# Patient Record
Sex: Female | Born: 1995 | Race: White | Hispanic: No | Marital: Married | Smoking: Never smoker
Health system: Southern US, Community
[De-identification: ages and names within clinical notes are randomized; demographics above are authoritative.]

## PROBLEM LIST (undated history)

## (undated) DIAGNOSIS — F419 Anxiety disorder, unspecified: Secondary | ICD-10-CM

---

## 2014-11-14 ENCOUNTER — Ambulatory Visit
Admit: 2014-11-14 | Disposition: A | Discharge status: Home / Self Care | Payer: Self-pay | Attending: Family Medicine | Admitting: Family Medicine

## 2016-03-21 ENCOUNTER — Ambulatory Visit
Admission: EM | Admit: 2016-03-21 | Discharge: 2016-03-21 | Disposition: A | Discharge status: Home / Self Care | Payer: Self-pay | Attending: Family Medicine | Admitting: Family Medicine

## 2016-03-21 DIAGNOSIS — N760 Acute vaginitis: Secondary | ICD-10-CM

## 2016-03-21 LAB — URINALYSIS COMPLETE WITH MICROSCOPIC (ARMC ONLY)
BILIRUBIN URINE: NEGATIVE
Bacteria, UA: NONE SEEN
Glucose, UA: NEGATIVE mg/dL
HGB URINE DIPSTICK: NEGATIVE
KETONES UR: NEGATIVE mg/dL
LEUKOCYTES UA: NEGATIVE
NITRITE: NEGATIVE
PH: 5.5 (ref 5.0–8.0)
Protein, ur: NEGATIVE mg/dL
RBC / HPF: NONE SEEN RBC/hpf (ref 0–5)
SPECIFIC GRAVITY, URINE: 1.015 (ref 1.005–1.030)

## 2016-03-21 LAB — PREGNANCY, URINE: Preg Test, Ur: NEGATIVE

## 2016-03-21 LAB — CHLAMYDIA/NGC RT PCR (ARMC ONLY)
Chlamydia Tr: NOT DETECTED
N GONORRHOEAE: NOT DETECTED

## 2016-03-21 LAB — WET PREP, GENITAL
Clue Cells Wet Prep HPF POC: NONE SEEN
SPERM: NONE SEEN
Trich, Wet Prep: NONE SEEN
Yeast Wet Prep HPF POC: NONE SEEN

## 2016-03-21 MED ORDER — FLUCONAZOLE 150 MG PO TABS: 150.0000 mg | ORAL_TABLET | Freq: Every day | ORAL | 0 refills | Status: DC

## 2016-03-21 NOTE — Discharge Instructions (Signed)
Take medication as prescribed. Rest. Drink plenty of fluids. Avoid douching.   Follow up with your primary care physician this week as needed. Return to Urgent care for new or worsening concerns.

## 2016-03-21 NOTE — ED Provider Notes (Signed)
[facility] ____________________________________________  Time seen: Approximately 2:35 PM  I have reviewed the triage vital signs and the nursing notes.   HISTORY  Chief Complaint Vaginal Itching   HPI Belinda Taylor is a 20 y.o. female presents with complaints of 5 days of vaginal itching and occasional vaginal discharge. Patient reports vaginal discharge is a whitish color. Denies odorous discharge. Patient denies any urinary frequency, urinary urgency or pain with urination. Denies concerns of STDs. Patient reports she is sexually active with one partner, denies concerns of STDs. Patient reports she was feeling a slight vaginal pressure sensation, now resolved and denies pain.  Denies pain at this time. Denies fevers. Reports continues to eat and drink well. Reports last sexual encounter was approximate 4 days ago. Denies douching. Denies chest pain, shortness of breath, abdominal pain, dysuria, neck pain, back pain, rash, extremity pain, extremity swelling. Denies recent medication changes or recent antibiotic use. Denies vaginal pain.  Patient's last menstrual period was 03/17/2016 (exact date). Denies chance of pregnancy.   History reviewed. No pertinent past medical history.  There are no active problems to display for this patient.   History reviewed. No pertinent surgical history.    No current facility-administered medications for this encounter.   Current Outpatient Prescriptions:  .  fluconazole (DIFLUCAN) 150 MG tablet, Take 1 tablet (150 mg total) by mouth daily. Take one pill orally, then Repeat in one week as needed., Disp: 2 tablet, Rfl: 0  Allergies Amoxicillin and Penicillins  History reviewed. No pertinent family history.  Social History Social History  Substance Use Topics  . Smoking status: Never Smoker  . Smokeless tobacco: Never Used  . Alcohol use No    Review of Systems Constitutional: No fever/chills Eyes: No visual  changes. ENT: No sore throat. Cardiovascular: Denies chest pain. Respiratory: Denies shortness of breath. Gastrointestinal: No abdominal pain.  No nausea, no vomiting.  No diarrhea.  No constipation. Genitourinary: Negative for dysuria. Musculoskeletal: Negative for back pain. Skin: Negative for rash. Neurological: Negative for headaches, focal weakness or numbness.  10-point ROS otherwise negative.  ____________________________________________   PHYSICAL EXAM:  VITAL SIGNS: ED Triage Vitals  Enc Vitals Group     BP 03/21/16 1318 118/82     Pulse Rate 03/21/16 1318 67     Resp 03/21/16 1318 15     Temp 03/21/16 1318 98.7 F (37.1 C)     Temp Source 03/21/16 1318 Oral     SpO2 03/21/16 1318 100 %     Weight 03/21/16 1316 196 lb (88.9 kg)     Height 03/21/16 1316 5\' 6"  (1.676 m)     Head Circumference --      Peak Flow --      Pain Score 03/21/16 1318 6     Pain Loc --      Pain Edu? --      Excl. in [place]? --     Constitutional: Alert and oriented. Well appearing and in no acute distress. Eyes: Conjunctivae are normal. PERRL. EOMI. ENT      Head: Normocephalic and atraumatic.      Nose: No congestion/rhinnorhea.      Mouth/Throat: Mucous membranes are moist.Oropharynx non-erythematous. Neck: No stridor. Supple without meningismus.  Hematological/Lymphatic/Immunilogical: No cervical lymphadenopathy. Cardiovascular: Normal rate, regular rhythm. Grossly normal heart sounds.  Good peripheral circulation. Respiratory: Normal respiratory effort without tachypnea nor retractions. Breath sounds are clear and equal bilaterally. No wheezes/rales/rhonchi.. Gastrointestinal: Soft and nontender. No distention. Normal Bowel sounds. No  CVA tenderness.  Pelvic: Completed with Melissa CMA at bedside as chaperone. External: Normal appearance, no rash or lesions noted. Speculum: Mild amount of white vaginal discharge, no bleeding, no foreign bodies, cervical is closed. Bimanual: Nontender.  No cervical or adnexal tenderness. Musculoskeletal:  Nontender with normal range of motion in all extremities. No midline cervical, thoracic or lumbar tenderness to palpation.  Neurologic:  Normal speech and language. No gross focal neurologic deficits are appreciated. Speech is normal. No gait instability.  Skin:  Skin is warm, dry and intact. No rash noted. Psychiatric: Mood and affect are normal. Speech and behavior are normal. Patient exhibits appropriate insight and judgment   ___________________________________________   LABS (all labs ordered are listed, but only abnormal results are displayed)  Labs Reviewed  WET PREP, GENITAL - Abnormal; Notable for the following:       Result Value   WBC, Wet Prep HPF POC FEW (*)    All other components within normal limits  URINALYSIS COMPLETEWITH MICROSCOPIC ([facility] ONLY) - Abnormal; Notable for the following:    Squamous Epithelial / LPF 0-5 (*)    All other components within normal limits  CHLAMYDIA/NGC RT PCR ([facility] ONLY)  PREGNANCY, URINE   ____________________________________________  RADIOLOGY  No results found. ____________________________________________   PROCEDURES Procedures     INITIAL IMPRESSION / ASSESSMENT AND PLAN / ED COURSE  Pertinent labs & imaging results that were available during my care of the patient were reviewed by me and considered in my medical decision making (see chart for details).  Well-appearing patient. No acute distress. Presents for the complaints of 5 days of vaginal itching and vaginal discharge. Urinalysis unremarkable. Wet prep with whitish discharge, wet prep negative for clue cells, negative trichomonas, negative for yeast, with a few WBCs present. Discussed in detail with patient suspects yeast vaginitis and will treat patient with Diflucan. Counsel regarding fluids, abstain from sexual intercourse until resolved, no douche and in close monitoring. Discussed indication, risks and  benefits of medications with patient.  Discussed follow up with Primary care physician this week. Discussed follow up and return parameters including no resolution or any worsening concerns. Patient verbalized understanding and agreed to plan.   ____________________________________________   FINAL CLINICAL IMPRESSION(S) / ED DIAGNOSES  Final diagnoses:  Vaginitis     Discharge Medication List as of 03/21/2016  2:46 PM    START taking these medications   Details  fluconazole (DIFLUCAN) 150 MG tablet Take 1 tablet (150 mg total) by mouth daily. Take one pill orally, then Repeat in one week as needed., Starting Sat 03/21/2016, Normal        Note: This dictation was prepared with Dragon dictation along with smaller phrase technology. Any transcriptional errors that result from this process are unintentional.    Clinical Course      Renford Dills, NP 03/21/16 1637    Renford Dills, NP 03/21/16 [phone]

## 2016-03-21 NOTE — ED Triage Notes (Signed)
Patient states that 5 days ago she started having lower abdominal pressure, vaginal itching. Patient states that itching is worse at night. Patient states that she has not had any urinary pain or burning. Patient has no known exposure to STDs.

## 2016-03-30 ENCOUNTER — Telehealth: Payer: Self-pay | Admitting: *Deleted

## 2016-03-30 NOTE — Telephone Encounter (Signed)
Called patient and informed her that her gonorrhea and chlamydia results came back negative. Patient confirmed understanding of information. 

## 2019-01-10 ENCOUNTER — Other Ambulatory Visit: Payer: Self-pay

## 2019-01-10 ENCOUNTER — Encounter: Payer: Self-pay | Admitting: Emergency Medicine

## 2019-01-10 ENCOUNTER — Other Ambulatory Visit: Payer: Self-pay | Admitting: Family Medicine

## 2019-01-10 ENCOUNTER — Ambulatory Visit
Admission: EM | Admit: 2019-01-10 | Discharge: 2019-01-10 | Disposition: A | Discharge status: Home / Self Care | Payer: Self-pay | Attending: Family Medicine | Admitting: Family Medicine

## 2019-01-10 ENCOUNTER — Ambulatory Visit (INDEPENDENT_AMBULATORY_CARE_PROVIDER_SITE_OTHER): Payer: Self-pay

## 2019-01-10 DIAGNOSIS — M25571 Pain in right ankle and joints of right foot: Secondary | ICD-10-CM

## 2019-01-10 DIAGNOSIS — W108XXA Fall (on) (from) other stairs and steps, initial encounter: Secondary | ICD-10-CM

## 2019-01-10 DIAGNOSIS — S93401A Sprain of unspecified ligament of right ankle, initial encounter: Secondary | ICD-10-CM

## 2019-01-10 HISTORY — DX: Anxiety disorder, unspecified: F41.9

## 2019-01-10 MED ORDER — MELOXICAM 15 MG PO TABS: 15.0000 mg | ORAL_TABLET | Freq: Every day | ORAL | 0 refills | Status: AC | PRN

## 2019-01-10 NOTE — ED Provider Notes (Signed)
[facility]    CSN: [id number] Arrival date & time: 01/10/19  1309  History   Chief Complaint Chief Complaint  Patient presents with  . Ankle Injury   HPI  23 year old female presents with the above complaint.  Patient reports that she fell down 3 steps coming down from her porch on Saturday.  She states that she twisted her ankle.  She reports right ankle swelling and moderate pain.  She states that it has been slowly improving with Tylenol, ibuprofen, rest, and ice.  However, she is concerned given continued pain as well as significant swelling.  She has significant bruising as well.  Worse with activity.  Denies foot pain.  No other associated symptoms.  No other complaints.  History reviewed as below. Past Medical History:  Diagnosis Date  . Anxiety    No past surgeries.  OB History   No obstetric history on file.    Home Medications    Prior to Admission medications   Medication Sig Start Date End Date Taking? Authorizing Provider  norgestimate-ethinyl estradiol (ORTHO-CYCLEN) 0.25-35 MG-MCG tablet Take 1 tablet by mouth daily.   Yes [provider]  sertraline (ZOLOFT) 50 MG tablet Take 50 mg by mouth daily.   Yes [provider]  meloxicam (MOBIC) 15 MG tablet Take 1 tablet (15 mg total) by mouth daily as needed. 01/10/19   Coral Spikes, DO   Social History Social History   Tobacco Use  . Smoking status: Never Smoker  . Smokeless tobacco: Never Used  Substance Use Topics  . Alcohol use: No  . Drug use: No   Allergies   Amoxicillin and Penicillins   Review of Systems Review of Systems   Physical Exam Triage Vital Signs ED Triage Vitals  Enc Vitals Group     BP 01/10/19 1326 (!) 140/92     Pulse Rate 01/10/19 1326 82     Resp 01/10/19 1326 18     Temp 01/10/19 1326 98.9 F (37.2 C)     Temp Source 01/10/19 1326 Oral     SpO2 01/10/19 1326 100 %     Weight 01/10/19 1323 210 lb (95.3 kg)     Height 01/10/19 1323 5\' 6"   (1.676 m)     Head Circumference --      Peak Flow --      Pain Score 01/10/19 1322 5     Pain Loc --      Pain Edu? --      Excl. in [place]? --    No data found.  Updated Vital Signs BP (!) 140/92 (BP Location: Right Arm)   Pulse 82   Temp 98.9 F (37.2 C) (Oral)   Resp 18   Ht 5\' 6"  (1.676 m)   Wt 95.3 kg   LMP 12/27/2018   SpO2 100%   BMI 33.89 kg/m   Visual Acuity Right Eye Distance:   Left Eye Distance:   Bilateral Distance:    Right Eye Near:   Left Eye Near:    Bilateral Near:     Physical Exam Vitals signs and nursing note reviewed.  Constitutional:      General: She is not in acute distress.    Appearance: Normal appearance. She is obese.  HENT:     Head: Normocephalic and atraumatic.  Eyes:     General:        Right eye: No discharge.        Left eye: No discharge.  Conjunctiva/sclera: Conjunctivae normal.  Pulmonary:     Effort: Pulmonary effort is normal. No respiratory distress.  Musculoskeletal:     Comments: Right ankle -significant swelling noted on the lateral aspect of the ankle as well as the medial aspect.  Bruising noted.  Neurological:     Mental Status: She is alert.  Psychiatric:        Mood and Affect: Mood normal.        Behavior: Behavior normal.    UC Treatments / Results  Labs (all labs ordered are listed, but only abnormal results are displayed) Labs Reviewed - No data to display  EKG None  Radiology Dg Ankle Complete Right  Result Date: 01/10/2019 CLINICAL DATA:  Right ankle pain after fall down steps yesterday. EXAM: RIGHT ANKLE - COMPLETE 3+ VIEW COMPARISON:  None. FINDINGS: There is no evidence of fracture, dislocation, or joint effusion. There is no evidence of arthropathy or other focal bone abnormality. Soft tissues are unremarkable. IMPRESSION: Negative. Electronically Signed   By: Lupita RaiderJames  Green Jr M.D.   On: 01/10/2019 13:45    Procedures Procedures (including critical care time)  Medications Ordered in UC  Medications - No data to display  Initial Impression / Assessment and Plan / UC Course  I have reviewed the triage vital signs and the nursing notes.  Pertinent labs & imaging results that were available during my care of the patient were reviewed by me and considered in my medical decision making (see chart for details).    23 year old female presents with a right ankle sprain.  X-ray negative.  Advised rest, ice, elevation.  Meloxicam as directed.  Final Clinical Impressions(s) / UC Diagnoses   Final diagnoses:  Sprain of right ankle, unspecified ligament, initial encounter   Discharge Instructions   None    ED Prescriptions    Medication Sig Dispense Auth. Provider   meloxicam (MOBIC) 15 MG tablet Take 1 tablet (15 mg total) by mouth daily as needed. 30 tablet Tommie Samsook, Cylah Fannin G, DO     Controlled Substance Prescriptions [place] Controlled Substance Registry consulted? Not Applicable   Tommie SamsCook, Terree Gaultney G, DO 01/10/19 1357

## 2019-01-10 NOTE — ED Triage Notes (Signed)
Patient states she fell on Saturday injuring her right ankle. She is complaining of pain and swelling to the right ankle area.

## 2019-06-30 ENCOUNTER — Ambulatory Visit (LOCAL_COMMUNITY_HEALTH_CENTER): Payer: Medicaid Other | Admitting: Advanced Practice Midwife

## 2019-06-30 ENCOUNTER — Other Ambulatory Visit: Payer: Self-pay

## 2019-06-30 VITALS — BP 105/73 | Ht 66.0 in | Wt 231.0 lb

## 2019-06-30 DIAGNOSIS — F419 Anxiety disorder, unspecified: Secondary | ICD-10-CM | POA: Insufficient documentation

## 2019-06-30 DIAGNOSIS — E669 Obesity, unspecified: Secondary | ICD-10-CM

## 2019-06-30 DIAGNOSIS — Z3009 Encounter for other general counseling and advice on contraception: Secondary | ICD-10-CM | POA: Diagnosis not present

## 2019-06-30 DIAGNOSIS — Z30011 Encounter for initial prescription of contraceptive pills: Secondary | ICD-10-CM

## 2019-06-30 MED ORDER — NORGESTIM-ETH ESTRAD TRIPHASIC 0.18/0.215/0.25 MG-25 MCG PO TABS
1.0000 | ORAL_TABLET | Freq: Every day | ORAL | 0 refills | Status: DC
Start: 2019-06-30 — End: 2019-07-27

## 2019-06-30 NOTE — Progress Notes (Addendum)
Here today for Contraception. Prefers Ocp's. Last PE was early 2019 with second pregnancy. Unsure of last Pap Smear. Requests evaluation for renewal/continuation of Zoloft. Declines STD testing as well as bloodwork. Hal Morales, RN

## 2019-06-30 NOTE — Progress Notes (Signed)
   [facility] problem visit  [place] Department  Subjective:  Kennice Finnie is a 23 y.o.G2P2 being seen today for needs more ocp refill and Zoloft refill given by Ob/Gyn in [place] during last pregnancy 03/2018  Chief Complaint  Patient presents with  . Contraception    HPI  Nonsmoker given ocp's after SVD at 56 3/7 on 04/13/18 by Ob/Gyn in [place] who also dx'd her during pregnancy with depression/anxiety and gave her Zoloft.  She thinks she ran out of Zoloft 01/2019 and ran out of ocp's maybe 03/2019.  Last coitus last night without condom.  Time before was 06/23/19 without condom.  Does not want pregnancy.  LMP 06/26/19.  Wants ocp's and Zoloft today. Does the patient have a current or past history of drug use? No   No components found for: HCV]   Health Maintenance Due  Topic Date Due  . HIV Screening  09/02/2008  . PAP-Cervical Cytology Screening  09/02/2014  . PAP SMEAR-Modifier  09/02/2014  . INFLUENZA VACCINE  02/18/2019    ROS  The following portions of the patient's history were reviewed and updated as appropriate: allergies, current medications, past family history, past medical history, past social history, past surgical history and problem list. Problem list updated.  Pt denies any hx HTN, stroke, migraines, blood clots, cancer   See flowsheet for other program required questions.  Objective:   Vitals:   06/30/19 0904  BP: 105/73  Weight: 231 lb (104.8 kg)  Height: 5\' 6"  (1.676 m)    Physical Exam  n/a  Assessment and Plan:  Phylliss Strege is a 23 y.o. female presenting to the [facility] Department for a Women's Health problem visit  1. Obesity, unspecified classification, unspecified obesity type, unspecified whether serious comorbidity present   2. Anxiety Referred to [facility] per pt request - Ambulatory referral to [place]  3. Family planning See note below Needs to RTC before  completion of ocp's for physical, pap and more ocp's - Norgestimate-Ethinyl Estradiol Triphasic 0.18/0.215/0.25 MG-25 MCG tab; Take 1 tablet by mouth daily.  Dispense: 1 Package; Refill: 0  4. Encounter for initial prescription of contraceptive pills E-rx OTC #1 I po daily to begin today Counseled on abstinance next 7 days and if no menses after completion of first pack of ocp's then do PT and call if + Pt declines ECP Please give primary care MD list to pt     No follow-ups on file.  No future appointments.  Herbie Saxon, CNM

## 2019-07-27 ENCOUNTER — Ambulatory Visit (LOCAL_COMMUNITY_HEALTH_CENTER): Payer: Medicaid Other | Admitting: Physician Assistant

## 2019-07-27 ENCOUNTER — Encounter: Payer: Self-pay | Admitting: Physician Assistant

## 2019-07-27 ENCOUNTER — Other Ambulatory Visit: Payer: Self-pay

## 2019-07-27 VITALS — BP 133/82 | Ht 66.0 in | Wt 231.4 lb

## 2019-07-27 DIAGNOSIS — Z6837 Body mass index (BMI) 37.0-37.9, adult: Secondary | ICD-10-CM

## 2019-07-27 DIAGNOSIS — Z3009 Encounter for other general counseling and advice on contraception: Secondary | ICD-10-CM

## 2019-07-27 DIAGNOSIS — E669 Obesity, unspecified: Secondary | ICD-10-CM

## 2019-07-27 LAB — PREGNANCY, URINE: Preg Test, Ur: NEGATIVE

## 2019-07-27 MED ORDER — MULTIVITAMINS PO CAPS: 1.0000 | ORAL_CAPSULE | Freq: Every day | ORAL | 0 refills | Status: AC

## 2019-07-27 MED ORDER — NORGESTIM-ETH ESTRAD TRIPHASIC 0.18/0.215/0.25 MG-25 MCG PO TABS: 1.0000 | ORAL_TABLET | Freq: Every day | ORAL | 11 refills | Status: AC

## 2019-07-27 NOTE — Progress Notes (Signed)
Family Planning Visit- Initial Visit  Subjective:  Belinda Taylor is a 24 y.o. being seen today for an initial well woman visit and to discuss family planning options.  She is currently using OCP (estrogen/progesterone) for pregnancy prevention. Patient reports she does not want a pregnancy in the next year.  Patient has the following medical conditions has Obesity 231 lbs BMI=37.2 and Anxiety/depression on their problem list.  Chief Complaint  Patient presents with  . Contraception  . Gynecologic Exam    Patient reports she started OCPs per [facility] last month, likes them and wants to continue after that initial pack. LMP 06/26/19. Last Pap during last pregnancy in 2019 = NIL. States she forgot to take her OCP and took 2 the next day on about 3 occasions during the pack she is completing. Has h/o anxious depression, has taken Zoloft for this in past and finds it helpful. Plans to have ongoing management of her sx at [place].  Patient denies headaches, elevated blood pressure/HTN, bleeding/clotting disorder. Does not smoke.  Body mass index is 37.35 kg/m. - Patient is eligible for diabetes screening based on BMI and age >65?  not applicable KC1E ordered? not applicable  Patient reports 1 of partners in last year. Desires STI screening?  No - has one stable partner and is not symptomatic.  Does the patient have a current or past history of drug use? No   No components found for: HCV]   Health Maintenance Due  Topic Date Due  . HIV Screening  09/02/2008  . PAP-Cervical Cytology Screening  09/02/2014  . PAP SMEAR-Modifier  09/02/2014  . INFLUENZA VACCINE  02/18/2019    ROS  The following portions of the patient's history were reviewed and updated as appropriate: allergies, current medications, past family history, past medical history, past social history, past surgical history and problem list. Problem list updated.   See flowsheet for other program required  questions.  Objective:   Vitals:   07/27/19 1452  BP: 133/82  Weight: 231 lb 6.4 oz (105 kg)  Height: 5\' 6"  (1.676 m)    Physical Exam    Assessment and Plan:  Belinda Taylor is a 24 y.o. female presenting to the [facility] Department for an initial well woman exam/family planning visit  Contraception counseling: Reviewed all forms of birth control options in the tiered based approach. available including abstinence; over the counter/barrier methods; hormonal contraceptive medication including pill, patch, ring, injection,contraceptive implant; hormonal and nonhormonal IUDs; permanent sterilization options including vasectomy and the various tubal sterilization modalities. Risks, benefits, and typical effectiveness rates were reviewed.  Questions were answered.  Written information was also given to the patient to review.  Patient desires COCPs, this was prescribed for patient. She will follow up in  12 mo for surveillance.  She was told to call with any further questions, or with any concerns about this method of contraception.  Emphasized use of condoms 100% of the time for STI prevention.  Patient was offered ECP. ECP was not accepted by the patient. ECP counseling was not given.   1. Family planning services UPT is negative today. Extensive discussion of BCM by tiered approach, including LARCs. Pt desires OCPs. Careful counseling re: need to take 1 pill daily at same time, not skip pills, to optimize contraceptive efficacy. Start combined OCPs. Please give MVI with folic acid.  2. Class 2 obesity with body mass index (BMI) of 37.0 to 37.9 in adult, unspecified obesity type,  unspecified whether serious comorbidity present Enc referral to nutritionist, diet, exercise. F/u with PCP.     Return in about 1 year (around 07/26/2020) for annual well-woman visit.  No future appointments.  Landry Dyke, PA-C

## 2019-07-27 NOTE — Progress Notes (Signed)
Here today for Physical and birth control. States last physical was PP exam in 2019 at [facility] provider . Unsure if she had a Pap Smear. Wants more Ocp's for birth control. Tawny Hopping, RN

## 2019-08-19 ENCOUNTER — Encounter: Payer: Self-pay | Admitting: Physician Assistant

## 2019-08-19 LAB — HM PAP SMEAR

## 2021-01-20 IMAGING — CR RIGHT ANKLE - COMPLETE 3+ VIEW
3 series · 3 of 3 positions shown · non-contrast
Comparison: None.

CLINICAL DATA: Right ankle pain after fall down steps yesterday.

EXAM:
RIGHT ANKLE - COMPLETE 3+ VIEW

[ankle ap]
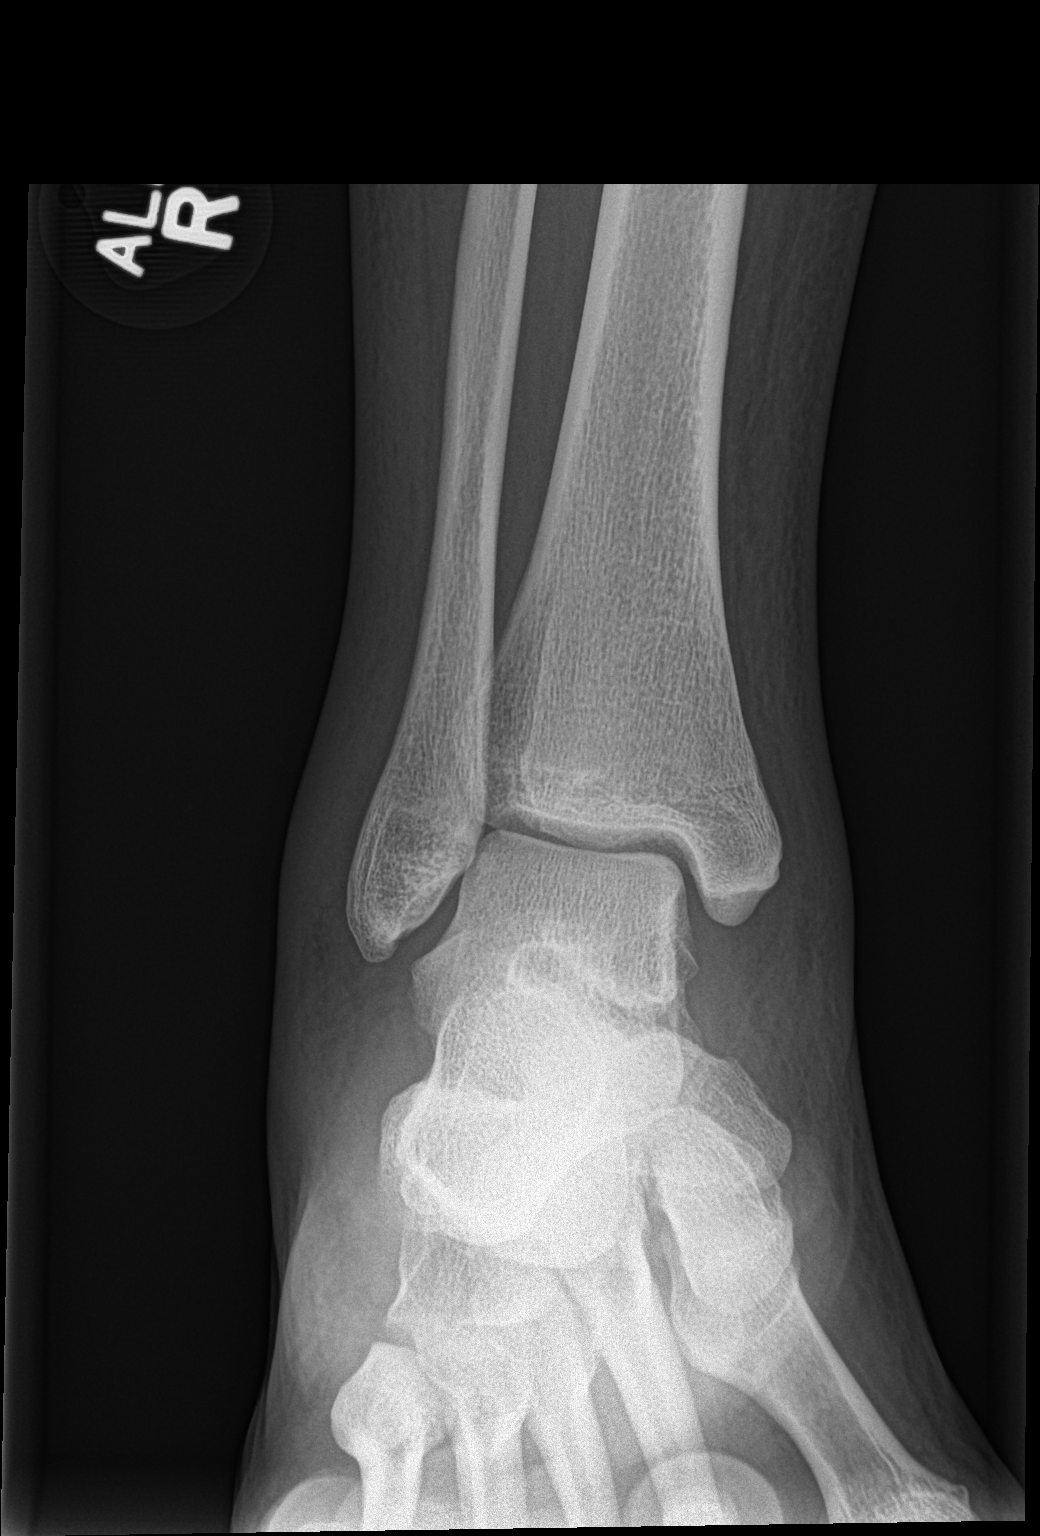

[ankle obl]
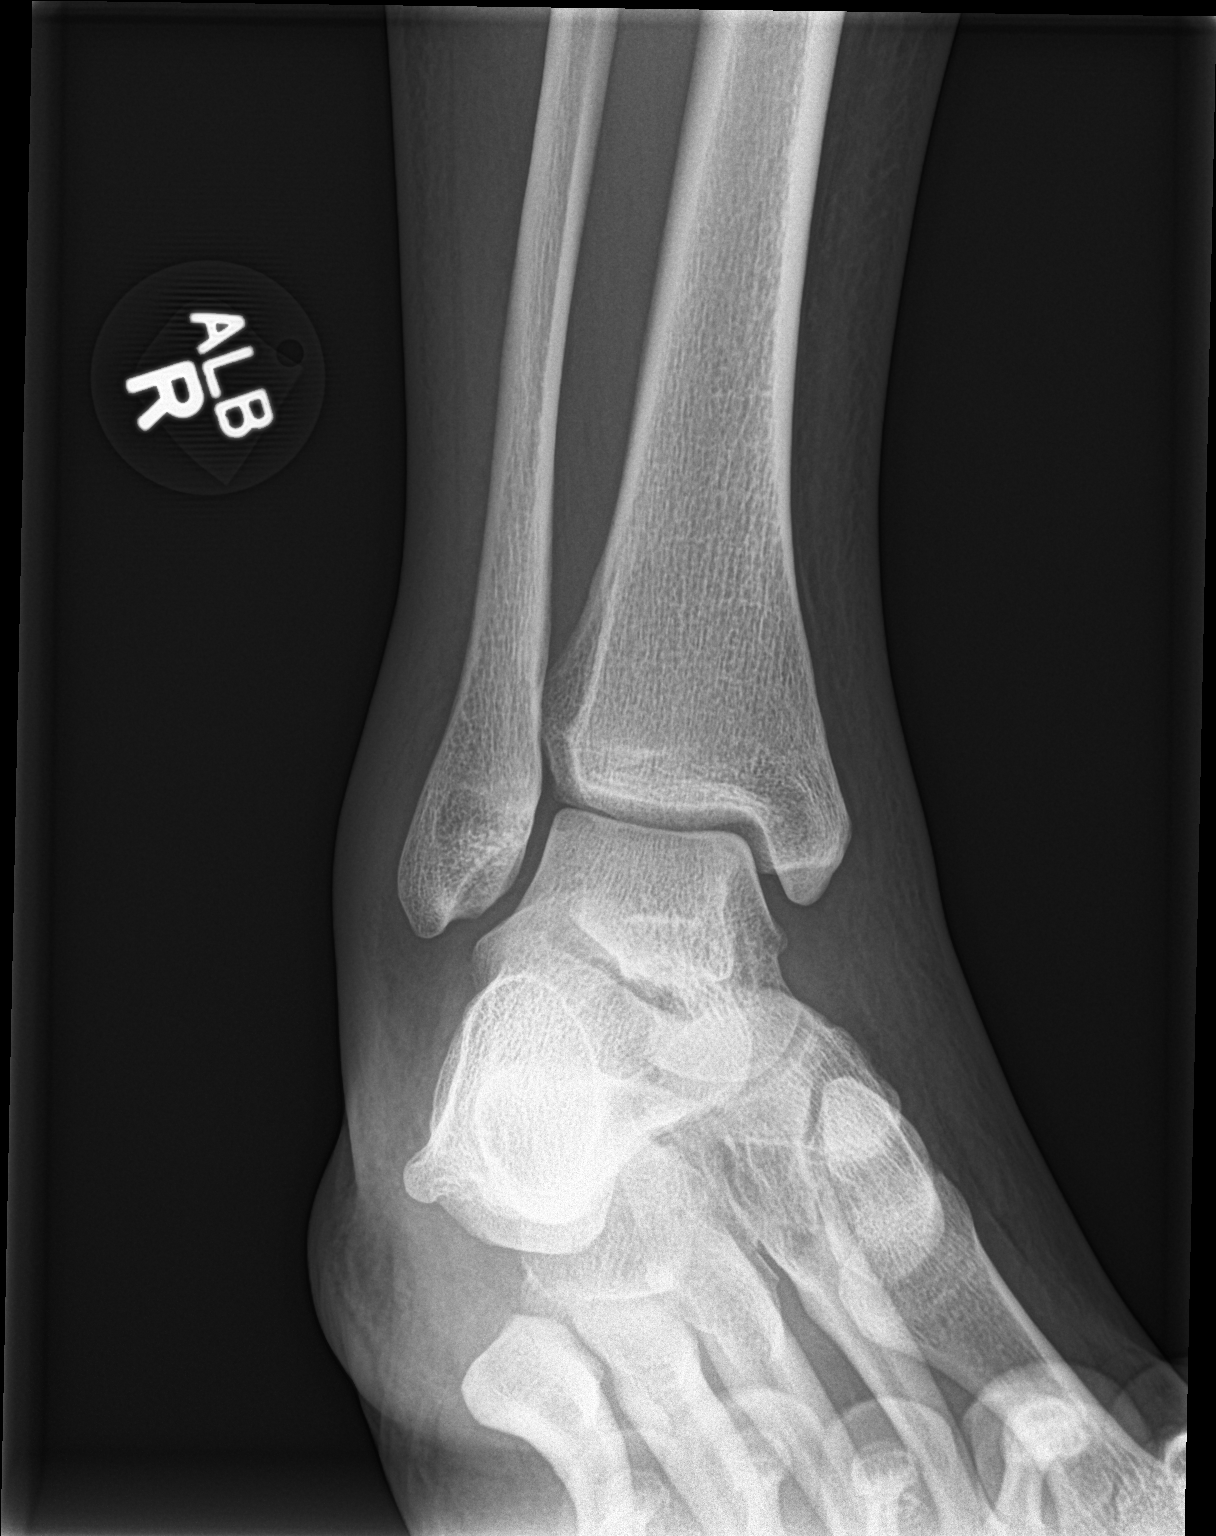

[ankle lat]
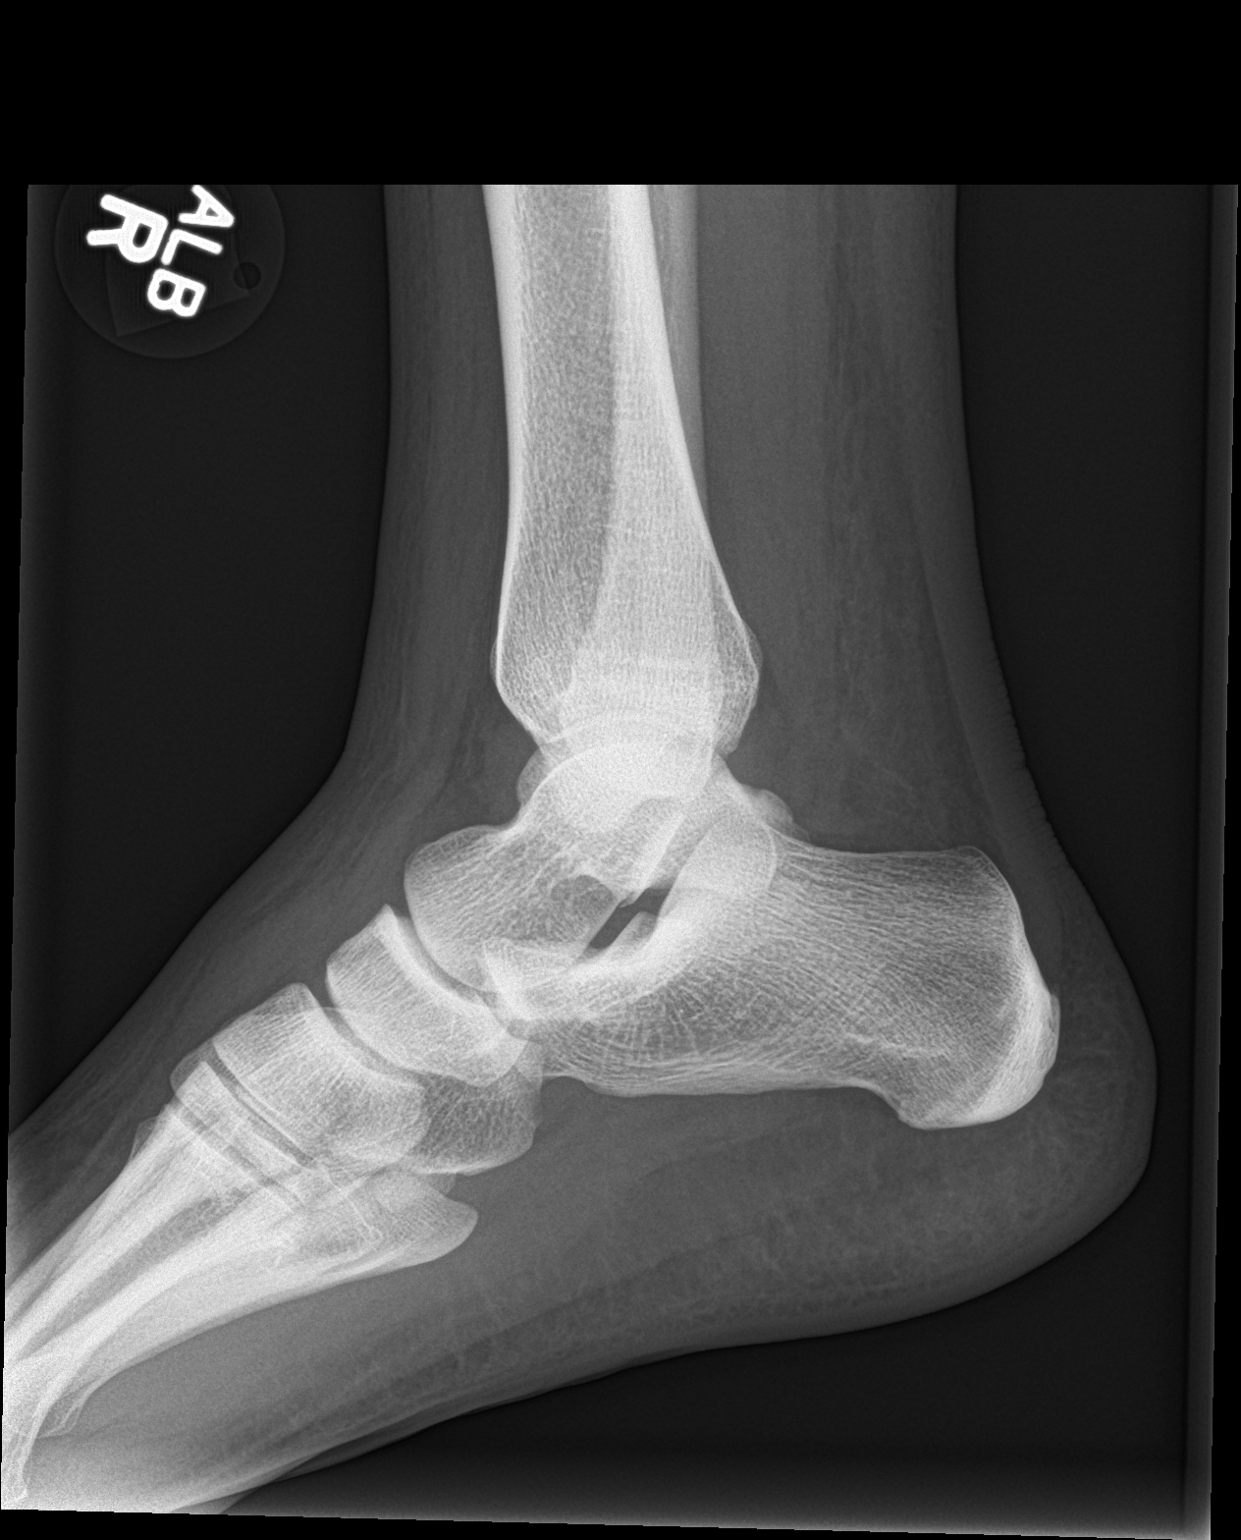

[3 of 3 positions shown; findings below may reference images not displayed]

FINDINGS: There is no evidence of fracture, dislocation, or joint effusion.
There is no evidence of arthropathy or other focal bone abnormality.
Soft tissues are unremarkable.
IMPRESSION: Negative.

## 2022-10-12 ENCOUNTER — Encounter: Payer: Self-pay | Admitting: Emergency Medicine

## 2022-10-12 ENCOUNTER — Ambulatory Visit
Admission: EM | Admit: 2022-10-12 | Discharge: 2022-10-12 | Disposition: A | Discharge status: Home / Self Care | Payer: Medicaid Other | Attending: Emergency Medicine | Admitting: Emergency Medicine

## 2022-10-12 DIAGNOSIS — N898 Other specified noninflammatory disorders of vagina: Secondary | ICD-10-CM | POA: Insufficient documentation

## 2022-10-12 DIAGNOSIS — N939 Abnormal uterine and vaginal bleeding, unspecified: Secondary | ICD-10-CM | POA: Insufficient documentation

## 2022-10-12 LAB — WET PREP, GENITAL
Clue Cells Wet Prep HPF POC: NONE SEEN
Sperm: NONE SEEN
Trich, Wet Prep: NONE SEEN
WBC, Wet Prep HPF POC: <10 — AB (ref <10)
Yeast Wet Prep HPF POC: NONE SEEN

## 2022-10-12 LAB — URINALYSIS, W/ REFLEX TO CULTURE (INFECTION SUSPECTED)
Bilirubin Urine: NEGATIVE
Glucose, UA: NEGATIVE mg/dL
Ketones, ur: NEGATIVE mg/dL
Leukocytes,Ua: NEGATIVE
Nitrite: NEGATIVE
Protein, ur: NEGATIVE mg/dL
Specific Gravity, Urine: 1.015 (ref 1.005–1.030)
WBC, UA: NONE SEEN WBC/hpf (ref 0–5)
pH: 5.5 (ref 5.0–8.0)

## 2022-10-12 MED ORDER — METRONIDAZOLE 500 MG PO TABS: 500.0000 mg | ORAL_TABLET | Freq: Two times a day (BID) | ORAL | 0 refills | Status: AC

## 2022-10-12 NOTE — ED Triage Notes (Signed)
Pt c/o vaginal itching, odor and lower back pain. Started about 4 days ago.

## 2022-10-12 NOTE — ED Provider Notes (Signed)
[facility]    CSN: [id number] Arrival date & time: 10/12/22  0907      History   Chief Complaint Chief Complaint  Patient presents with   Vaginitis    HPI Belinda Taylor is a 27 y.o. female.   Patient presents for evaluation of vaginal itching, vaginal odor, bilateral lower back pain, present for 4 days.  Has had intermittent vaginal spotting since symptoms began.  Has been experiencing mild urinary frequency but unsure if related to chronic GI issues.  Sexually active, no concern for STD.  Denies fevers, lower abdominal pain or pressure, vaginal discharge, dysuria.   Past Medical History:  Diagnosis Date   Anxiety     Patient Active Problem List   Diagnosis Date Noted   Obesity 231 lbs BMI=37.2 06/30/2019   Anxiety/depression 06/30/2019    History reviewed. No pertinent surgical history.  OB History   No obstetric history on file.      Home Medications    Prior to Admission medications   Medication Sig Start Date End Date Taking? Authorizing Provider  meloxicam (MOBIC) 15 MG tablet Take 1 tablet (15 mg total) by mouth daily as needed. Patient not taking: Reported on 06/30/2019 01/10/19   Coral Spikes, DO  Norgestimate-Ethinyl Estradiol Triphasic 0.18/0.215/0.25 MG-25 MCG tab Take 1 tablet by mouth daily. 07/27/19   Streilein, Annamarie, PA-C  sertraline (ZOLOFT) 50 MG tablet Take 50 mg by mouth daily.    [provider]    Family History Family History  Problem Relation Age of Onset   Hypertension Father    Diabetes Paternal Grandmother     Social History Social History   Tobacco Use   Smoking status: Never   Smokeless tobacco: Former  Scientific laboratory technician Use: Never used  Substance Use Topics   Alcohol use: No   Drug use: No     Allergies   Amoxicillin and Penicillins   Review of Systems Review of Systems   Physical Exam Triage Vital Signs ED Triage Vitals  Enc Vitals Group     BP 10/12/22 1040 136/80      Pulse Rate 10/12/22 1040 78     Resp 10/12/22 1040 16     Temp 10/12/22 1040 98.1 F (36.7 C)     Temp Source 10/12/22 1040 Oral     SpO2 10/12/22 1040 100 %     Weight 10/12/22 1039 231 lb 7.7 oz (105 kg)     Height 10/12/22 1039 5\' 6"  (1.676 m)     Head Circumference --      Peak Flow --      Pain Score 10/12/22 1038 0     Pain Loc --      Pain Edu? --      Excl. in [place]? --    No data found.  Updated Vital Signs BP 136/80 (BP Location: Right Arm)   Pulse 78   Temp 98.1 F (36.7 C) (Oral)   Resp 16   Ht 5\' 6"  (1.676 m)   Wt 231 lb 7.7 oz (105 kg)   LMP 09/28/2022 (Approximate)   SpO2 100%   BMI 37.36 kg/m   Visual Acuity Right Eye Distance:   Left Eye Distance:   Bilateral Distance:    Right Eye Near:   Left Eye Near:    Bilateral Near:     Physical Exam Constitutional:      Appearance: Normal appearance.  Eyes:     Extraocular Movements: Extraocular  movements intact.  Pulmonary:     Effort: Pulmonary effort is normal.  Abdominal:     General: Abdomen is flat. Bowel sounds are normal.     Palpations: Abdomen is soft.     Tenderness: There is abdominal tenderness in the suprapubic area.  Neurological:     Mental Status: She is alert and oriented to person, place, and time. Mental status is at baseline.      UC Treatments / Results  Labs (all labs ordered are listed, but only abnormal results are displayed) Labs Reviewed  WET PREP, GENITAL - Abnormal; Notable for the following components:      Result Value   WBC, Wet Prep HPF POC <10 (*)    All other components within normal limits  URINALYSIS, W/ REFLEX TO CULTURE (INFECTION SUSPECTED) - Abnormal; Notable for the following components:   Hgb urine dipstick SMALL (*)    Bacteria, UA RARE (*)    All other components within normal limits    EKG   Radiology No results found.  Procedures Procedures (including critical care time)  Medications Ordered in UC Medications - No data to  display  Initial Impression / Assessment and Plan / UC Course  I have reviewed the triage vital signs and the nursing notes.  Pertinent labs & imaging results that were available during my care of the patient were reviewed by me and considered in my medical decision making (see chart for details).  Spotting, vaginal itching and vaginal odor  Urinalysis and wet prep negative, treating prophylactically for BV despite testing as patient has had vaginal spotting in the past with positive BV testing, discussed, metronidazole prescribed, advised to keep alcohol use, discussed additional supportive measures, advised follow-up if symptoms continue to persist Final Clinical Impressions(s) / UC Diagnoses   Final diagnoses:  None   Discharge Instructions   None    ED Prescriptions   None    PDMP not reviewed this encounter.   Hans Eden, NP 10/12/22 1224

## 2022-10-12 NOTE — Discharge Instructions (Signed)
Today you are being treated prophylactically for  Bacterial vaginosis even though testing today was negative for bacterial vaginosis, yeast and trichomoniasis, you  had similar vaginal spotting in the past   Urinalysis negative for bladder infection  Take Metronidazole 500 mg twice a day for 7 days, do not drink alcohol while using medication, this will make you feel sick   Bacterial vaginosis which results from an overgrowth of one on several organisms that are normally present in your vagina. Vaginosis is an inflammation of the vagina that can result in discharge, itching and pain.  In addition: Avoid baths, hot tubs and whirlpool spas.  Don't use scented or harsh soaps Avoid irritants. These include scented tampons and pads. Wipe from front to back after using the toilet. Don't douche. Your vagina doesn't require cleansing other than normal bathing.  Use a condom.  Wear cotton underwear, this fabric absorbs some moisture.

## 2023-05-15 ENCOUNTER — Ambulatory Visit: Payer: Medicaid Other

## 2023-05-15 ENCOUNTER — Ambulatory Visit
Admission: EM | Admit: 2023-05-15 | Discharge: 2023-05-15 | Disposition: A | Discharge status: Home / Self Care | Payer: Medicaid Other | Attending: Physician Assistant | Admitting: Physician Assistant

## 2023-05-15 DIAGNOSIS — W19XXXA Unspecified fall, initial encounter: Secondary | ICD-10-CM | POA: Diagnosis not present

## 2023-05-15 DIAGNOSIS — B349 Viral infection, unspecified: Secondary | ICD-10-CM | POA: Insufficient documentation

## 2023-05-15 DIAGNOSIS — M546 Pain in thoracic spine: Secondary | ICD-10-CM | POA: Diagnosis not present

## 2023-05-15 DIAGNOSIS — J029 Acute pharyngitis, unspecified: Secondary | ICD-10-CM

## 2023-05-15 DIAGNOSIS — G8929 Other chronic pain: Secondary | ICD-10-CM | POA: Diagnosis not present

## 2023-05-15 DIAGNOSIS — Z87891 Personal history of nicotine dependence: Secondary | ICD-10-CM | POA: Insufficient documentation

## 2023-05-15 DIAGNOSIS — Z1152 Encounter for screening for COVID-19: Secondary | ICD-10-CM | POA: Diagnosis not present

## 2023-05-15 DIAGNOSIS — R509 Fever, unspecified: Secondary | ICD-10-CM | POA: Diagnosis not present

## 2023-05-15 DIAGNOSIS — R197 Diarrhea, unspecified: Secondary | ICD-10-CM | POA: Diagnosis not present

## 2023-05-15 DIAGNOSIS — R1032 Left lower quadrant pain: Secondary | ICD-10-CM | POA: Insufficient documentation

## 2023-05-15 DIAGNOSIS — R52 Pain, unspecified: Secondary | ICD-10-CM

## 2023-05-15 DIAGNOSIS — R0981 Nasal congestion: Secondary | ICD-10-CM

## 2023-05-15 LAB — SARS CORONAVIRUS 2 BY RT PCR: SARS Coronavirus 2 by RT PCR: NEGATIVE

## 2023-05-15 LAB — URINALYSIS, W/ REFLEX TO CULTURE (INFECTION SUSPECTED)
Bilirubin Urine: NEGATIVE
Glucose, UA: NEGATIVE mg/dL
Ketones, ur: 40 mg/dL — AB
Leukocytes,Ua: NEGATIVE
Nitrite: NEGATIVE
Protein, ur: NEGATIVE mg/dL
Specific Gravity, Urine: 1.015 (ref 1.005–1.030)
pH: 6 (ref 5.0–8.0)

## 2023-05-15 LAB — GROUP A STREP BY PCR: Group A Strep by PCR: NOT DETECTED

## 2023-05-15 MED ORDER — NAPROXEN 500 MG PO TABS: 500.0000 mg | ORAL_TABLET | Freq: Two times a day (BID) | ORAL | 0 refills | Status: AC

## 2023-05-15 NOTE — ED Triage Notes (Signed)
Pt c/o possible UTI x4days  Pt states that she fell on Tuesday and has had body chills since then.   Pt denies any vaginal odor or discharge.   Pt states that she has been having a dull ache in the lower left abdomen since Tuesday.

## 2023-05-15 NOTE — ED Provider Notes (Signed)
[facility]    CSN: [id number] Arrival date & time: 05/15/23  1027      History   Chief Complaint Chief Complaint  Patient presents with   Urinary Tract Infection    HPI Belinda Taylor is a 27 y.o. female presenting for 3 day history of low grade temps up to 100 degrees, chills, sweats, body aches, headaches, and mild fatigue. Reports sore throat and nasal congestion for the past couple of days. Denies cough, chest pain, or shortness of breath. Reports sharp intermittent left lower abdominal pain since yesterday. Has had upper back pain related to fall 4 days ago. History of chronic lower back pain. Denies nausea/vomiting. Mild diarrhea but has history of this.  Has Bm this morning and it was diarrhea. Denies urinary frequency/urgency or pain. Denies vaginal discharge or odor at this time. Daughter has a sore throat, but no other sick contacts. Has been taking Tylenol and Ibuprofen with last dose of Tylenol last night.   Patient was seen at emergency department yesterday for complaints of thoracic back pain after falling.  She had a CT of her thoracic spine which was negative.  She also complained of sore throat for the past couple days and had a negative rapid strep test.  Was advised to take ibuprofen and Tylenol.  HPI  Past Medical History:  Diagnosis Date   Anxiety     Patient Active Problem List   Diagnosis Date Noted   Obesity 231 lbs BMI=37.2 06/30/2019   Anxiety/depression 06/30/2019    History reviewed. No pertinent surgical history.  OB History   No obstetric history on file.      Home Medications    Prior to Admission medications   Medication Sig Start Date End Date Taking? Authorizing Provider  naproxen (NAPROSYN) 500 MG tablet Take 1 tablet (500 mg total) by mouth 2 (two) times daily. 05/15/23  Yes Shirlee Latch, PA-C  meloxicam (MOBIC) 15 MG tablet Take 1 tablet (15 mg total) by mouth daily as needed. Patient not taking: Reported on  06/30/2019 01/10/19   Tommie Sams, DO  metroNIDAZOLE (FLAGYL) 500 MG tablet Take 1 tablet (500 mg total) by mouth 2 (two) times daily. 10/12/22   Valinda Hoar, NP  Norgestimate-Ethinyl Estradiol Triphasic 0.18/0.215/0.25 MG-25 MCG tab Take 1 tablet by mouth daily. 07/27/19   Streilein, Annamarie, PA-C  sertraline (ZOLOFT) 50 MG tablet Take 50 mg by mouth daily.    [provider]    Family History Family History  Problem Relation Age of Onset   Hypertension Father    Diabetes Paternal Grandmother     Social History Social History   Tobacco Use   Smoking status: Never   Smokeless tobacco: Former  Building services engineer status: Never Used  Substance Use Topics   Alcohol use: No   Drug use: No     Allergies   Amoxicillin and Penicillins   Review of Systems Review of Systems  Constitutional:  Positive for chills, diaphoresis, fatigue and fever.  HENT:  Positive for congestion, rhinorrhea and sore throat.   Respiratory:  Negative for cough, shortness of breath and wheezing.   Cardiovascular:  Negative for chest pain.  Gastrointestinal:  Positive for diarrhea. Negative for abdominal pain, constipation, nausea and vomiting.  Genitourinary:  Negative for difficulty urinating, dysuria, frequency, hematuria and pelvic pain.  Musculoskeletal:  Positive for back pain. Negative for myalgias.  Neurological:  Positive for headaches. Negative for weakness.  Physical Exam Triage Vital Signs ED Triage Vitals  Encounter Vitals Group     BP      Systolic BP Percentile      Diastolic BP Percentile      Pulse      Resp      Temp      Temp src      SpO2      Weight      Height      Head Circumference      Peak Flow      Pain Score      Pain Loc      Pain Education      Exclude from Growth Chart    No data found.  Updated Vital Signs BP 107/75 (BP Location: Left Arm)   Pulse 95   Temp 99.3 F (37.4 C) (Oral)   Ht 5\' 6"  (1.676 m)   Wt 230 lb (104.3 kg)    LMP 05/01/2023   SpO2 100%   BMI 37.12 kg/m     Physical Exam Vitals and nursing note reviewed.  Constitutional:      General: She is not in acute distress.    Appearance: Normal appearance. She is not ill-appearing or toxic-appearing.  HENT:     Head: Normocephalic and atraumatic.     Right Ear: Tympanic membrane, ear canal and external ear normal.     Left Ear: Tympanic membrane, ear canal and external ear normal.     Nose: Nose normal.     Mouth/Throat:     Mouth: Mucous membranes are moist.     Pharynx: Oropharynx is clear. Posterior oropharyngeal erythema present.  Eyes:     General: No scleral icterus.       Right eye: No discharge.        Left eye: No discharge.     Conjunctiva/sclera: Conjunctivae normal.  Cardiovascular:     Rate and Rhythm: Normal rate and regular rhythm.     Heart sounds: Normal heart sounds.  Pulmonary:     Effort: Pulmonary effort is normal. No respiratory distress.     Breath sounds: Normal breath sounds.  Abdominal:     Palpations: Abdomen is soft.     Tenderness: There is no abdominal tenderness. There is no right CVA tenderness or left CVA tenderness.  Musculoskeletal:        General: Tenderness (TTP along t-spine) present. Normal range of motion.     Cervical back: Neck supple.  Skin:    General: Skin is dry.  Neurological:     General: No focal deficit present.     Mental Status: She is alert. Mental status is at baseline.     Motor: No weakness.     Gait: Gait normal.  Psychiatric:        Mood and Affect: Mood normal.        Behavior: Behavior normal.      UC Treatments / Results  Labs (all labs ordered are listed, but only abnormal results are displayed) Labs Reviewed  URINALYSIS, W/ REFLEX TO CULTURE (INFECTION SUSPECTED) - Abnormal; Notable for the following components:      Result Value   Hgb urine dipstick TRACE (*)    Ketones, ur 40 (*)    Bacteria, UA FEW (*)    All other components within normal limits  SARS  CORONAVIRUS 2 BY RT PCR  GROUP A STREP BY PCR    EKG   Radiology No results found.  Narrative  05/15/2023 12:10 AM EDT  EXAM: Computed tomography, thoracic spine without contrast material. DATE: 05/14/2023 11:46 PM ACCESSION: [id number] UN DICTATED: 05/14/2023 11:55 PM INTERPRETATION LOCATION: [facility]  CLINICAL INDICATION: 27 years old Female with injury to back w/ tenderness    COMPARISON: None  TECHNIQUE: Axial CT images through the thoracic spine without contrast. Coronal and sagittal reformatted images, bone and soft tissue algorithm are provided.  FINDINGS:   There is no fracture. The vertebrae are normally aligned. No paravertebral soft tissue abnormality.  Hepatic steatosis.    Procedures Procedures (including critical care time)  Medications Ordered in UC Medications - No data to display  Initial Impression / Assessment and Plan / UC Course  I have reviewed the triage vital signs and the nursing notes.  Pertinent labs & imaging results that were available during my care of the patient were reviewed by me and considered in my medical decision making (see chart for details).   27 year old female presents for 3-day history of low-grade fever up to 100 degrees, fatigue, headaches, body aches, congestion, sore throat, chills, sweats.  Also reports low back pain and left lower abdominal pain.  Patient does report a fall onto her back while hiking the day before onset of symptoms.  She was unsure if that was related.  She went to the emergency department yesterday with complaints of back pain and sore throat.  She had a normal T-spine CT scan and negative rapid strep test.  She was advised to continue with Tylenol and Motrin.  Patient says that she is not taking these medications around-the-clock she has return of body aches, headaches and feels badly.  Her daughter is sick with a sore throat as well.  Patient is concerned she could have a UTI despite not having  any urinary symptoms.  Vitals are normal and stable and the patient is overall well-appearing.  On exam she has erythema posterior pharynx.  Chest clear to auscultation and heart regular rate and rhythm.  Abdomen soft without any tenderness.  No CVA tenderness.  Urinalysis performed at patient request is not consistent with urinary tract infection.  COVID test and PCR strep test obtained.  Chest x-ray obtained to assess for possible underlying pneumonia given that she is feeling fairly poorly.  Strep and COVID are negative.  Wet read chest x-ray is normal.  Reviewed this with patient.  If over read shows any abnormalities I will contact her and recommend further treatment.  Viral upper respiratory infection seems most likely the cause of her symptoms.  Back pain likely secondary to the fall but she had negative CT imaging done yesterday for fracture.  At this time encouraged increasing rest and fluids.  Sent naproxen to pharmacy and encouraged use of Tylenol.  Reviewed close monitoring and for any higher fevers or worsening/new symptoms she should be seen again immediately and reevaluated.  1 acute illness with systemic symptoms.  Negative chest x-ray over read.   Final Clinical Impressions(s) / UC Diagnoses   Final diagnoses:  Viral illness  Sore throat  Nasal congestion  Body aches  Low grade fever  Abdominal pain, left lower quadrant  Acute midline thoracic back pain     Discharge Instructions      -Negative strep and COVID test. - Chest x-ray performed I did not see pneumonia but if the radiologist sees pneumonia I will call you and send antibiotics. - You do not have a UTI. - Your symptoms are consistent with a viral upper respiratory infection.  Encourage you to increase your fluids and continue the Tylenol and try the Naproxen instead of motrin.  Usually the symptoms resolve within 7 to 10 days.  If you develop a fever greater than 101, have worsening abdominal pain,  have chest pain, weakness, shortness of breath, flank pain, severe headaches, severe neck pain you should be seen again and reevaluated.     ED Prescriptions     Medication Sig Dispense Auth. Provider   naproxen (NAPROSYN) 500 MG tablet Take 1 tablet (500 mg total) by mouth 2 (two) times daily. 30 tablet Gareth Nikaela      PDMP not reviewed this encounter.   Shirlee Latch, PA-C 05/15/23 1156

## 2023-05-15 NOTE — Discharge Instructions (Addendum)
-  Negative strep and COVID test. - Chest x-ray performed I did not see pneumonia but if the radiologist sees pneumonia I will call you and send antibiotics. - You do not have a UTI. - Your symptoms are consistent with a viral upper respiratory infection.  Encourage you to increase your fluids and continue the Tylenol and try the Naproxen instead of motrin.  Usually the symptoms resolve within 7 to 10 days.  If you develop a fever greater than 101, have worsening abdominal pain, have chest pain, weakness, shortness of breath, flank pain, severe headaches, severe neck pain you should be seen again and reevaluated.
